# Patient Record
Sex: Female | Born: 2005 | Race: White | Hispanic: No | Marital: Single
Health system: Southern US, Community
[De-identification: ages and names within clinical notes are randomized; demographics above are authoritative.]

## PROBLEM LIST (undated history)

## (undated) ENCOUNTER — Ambulatory Visit: Payer: MEDICAID | Source: Home / Self Care

## (undated) DIAGNOSIS — F32A Depression, unspecified: Secondary | ICD-10-CM

## (undated) DIAGNOSIS — F419 Anxiety disorder, unspecified: Secondary | ICD-10-CM

## (undated) DIAGNOSIS — F909 Attention-deficit hyperactivity disorder, unspecified type: Secondary | ICD-10-CM

## (undated) DIAGNOSIS — F319 Bipolar disorder, unspecified: Secondary | ICD-10-CM

## (undated) DIAGNOSIS — Z7289 Other problems related to lifestyle: Secondary | ICD-10-CM

## (undated) DIAGNOSIS — F329 Major depressive disorder, single episode, unspecified: Secondary | ICD-10-CM

## (undated) HISTORY — PX: EYE SURGERY: SHX253

---
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
## 2023-01-22 ENCOUNTER — Encounter: Payer: MEDICAID | Admitting: Family

## 2023-01-22 NOTE — Progress Notes (Signed)
 Erroneous encounter-disregard

## 2024-04-14 ENCOUNTER — Ambulatory Visit (HOSPITAL_COMMUNITY): Payer: Self-pay | Admitting: Psychiatry
